# Patient Record
Sex: Male | Born: 1970 | Hispanic: No | Marital: Married | Smoking: Never smoker
Health system: Southern US, Community
[De-identification: ages and names within clinical notes are randomized; demographics above are authoritative.]

## PROBLEM LIST (undated history)

## (undated) DIAGNOSIS — R011 Cardiac murmur, unspecified: Secondary | ICD-10-CM

## (undated) HISTORY — PX: HERNIA REPAIR: SHX51

---

## 1999-06-11 ENCOUNTER — Emergency Department (HOSPITAL_COMMUNITY): Admission: EM | Admit: 1999-06-11 | Discharge: 1999-06-11 | Payer: Self-pay | Admitting: Emergency Medicine

## 2008-08-02 ENCOUNTER — Encounter: Admission: RE | Admit: 2008-08-02 | Discharge: 2008-08-02 | Payer: Self-pay | Admitting: Family Medicine

## 2015-09-30 ENCOUNTER — Encounter (HOSPITAL_COMMUNITY): Payer: Self-pay | Admitting: Emergency Medicine

## 2015-09-30 ENCOUNTER — Emergency Department (HOSPITAL_COMMUNITY)
Admission: EM | Admit: 2015-09-30 | Discharge: 2015-09-30 | Discharge status: Against Medical Advice | Payer: BLUE CROSS/BLUE SHIELD | Attending: Emergency Medicine | Admitting: Emergency Medicine

## 2015-09-30 ENCOUNTER — Emergency Department (HOSPITAL_COMMUNITY): Payer: BLUE CROSS/BLUE SHIELD

## 2015-09-30 DIAGNOSIS — R202 Paresthesia of skin: Secondary | ICD-10-CM | POA: Insufficient documentation

## 2015-09-30 DIAGNOSIS — R011 Cardiac murmur, unspecified: Secondary | ICD-10-CM | POA: Insufficient documentation

## 2015-09-30 DIAGNOSIS — R079 Chest pain, unspecified: Secondary | ICD-10-CM | POA: Insufficient documentation

## 2015-09-30 DIAGNOSIS — R0602 Shortness of breath: Secondary | ICD-10-CM | POA: Insufficient documentation

## 2015-09-30 DIAGNOSIS — R05 Cough: Secondary | ICD-10-CM | POA: Diagnosis not present

## 2015-09-30 HISTORY — DX: Cardiac murmur, unspecified: R01.1

## 2015-09-30 LAB — CBC
HEMATOCRIT: 41.6 % (ref 39.0–52.0)
Hemoglobin: 14.2 g/dL (ref 13.0–17.0)
MCH: 29.6 pg (ref 26.0–34.0)
MCHC: 34.1 g/dL (ref 30.0–36.0)
MCV: 86.8 fL (ref 78.0–100.0)
Platelets: 301 10*3/uL (ref 150–400)
RBC: 4.79 MIL/uL (ref 4.22–5.81)
RDW: 13.1 % (ref 11.5–15.5)
WBC: 8.2 10*3/uL (ref 4.0–10.5)

## 2015-09-30 LAB — BASIC METABOLIC PANEL
Anion gap: 8 (ref 5–15)
BUN: 14 mg/dL (ref 6–20)
CHLORIDE: 105 mmol/L (ref 101–111)
CO2: 28 mmol/L (ref 22–32)
Calcium: 8.9 mg/dL (ref 8.9–10.3)
Creatinine, Ser: 0.81 mg/dL (ref 0.61–1.24)
GFR calc non Af Amer: >60 mL/min (ref >60)
Glucose, Bld: 100 mg/dL — ABNORMAL HIGH (ref 65–99)
POTASSIUM: 4.3 mmol/L (ref 3.5–5.1)
SODIUM: 141 mmol/L (ref 135–145)

## 2015-09-30 LAB — I-STAT TROPONIN, ED: Troponin i, poc: 0 ng/mL (ref 0.00–0.08)

## 2015-09-30 NOTE — ED Notes (Signed)
Pt states that  He cannot wait any longer because he has a 43 year old son at home. Pt asked about results of test, explained that an MD must go over results. Pt asked if our MD could call him, explained that he should call his PCP tomorrow and ask them to get his results. Pt asking about cost. Explained that I did not have the answer to that but he should call his MD. Pt states understanding. Apologized for length of wait.

## 2015-09-30 NOTE — ED Notes (Signed)
Pt. reports left chest pain " pinching / tingling" with SOB and productive cough onset 4 days ago , denies nausea or diaphoresis .

## 2016-05-17 IMAGING — CR DG CHEST 2V
2 series · 2 of 2 positions shown · non-contrast
Comparison: 08/02/2008

CLINICAL DATA: Left-sided chest pain and cough for days.

EXAM:
CHEST  2 VIEW

[chest pa]
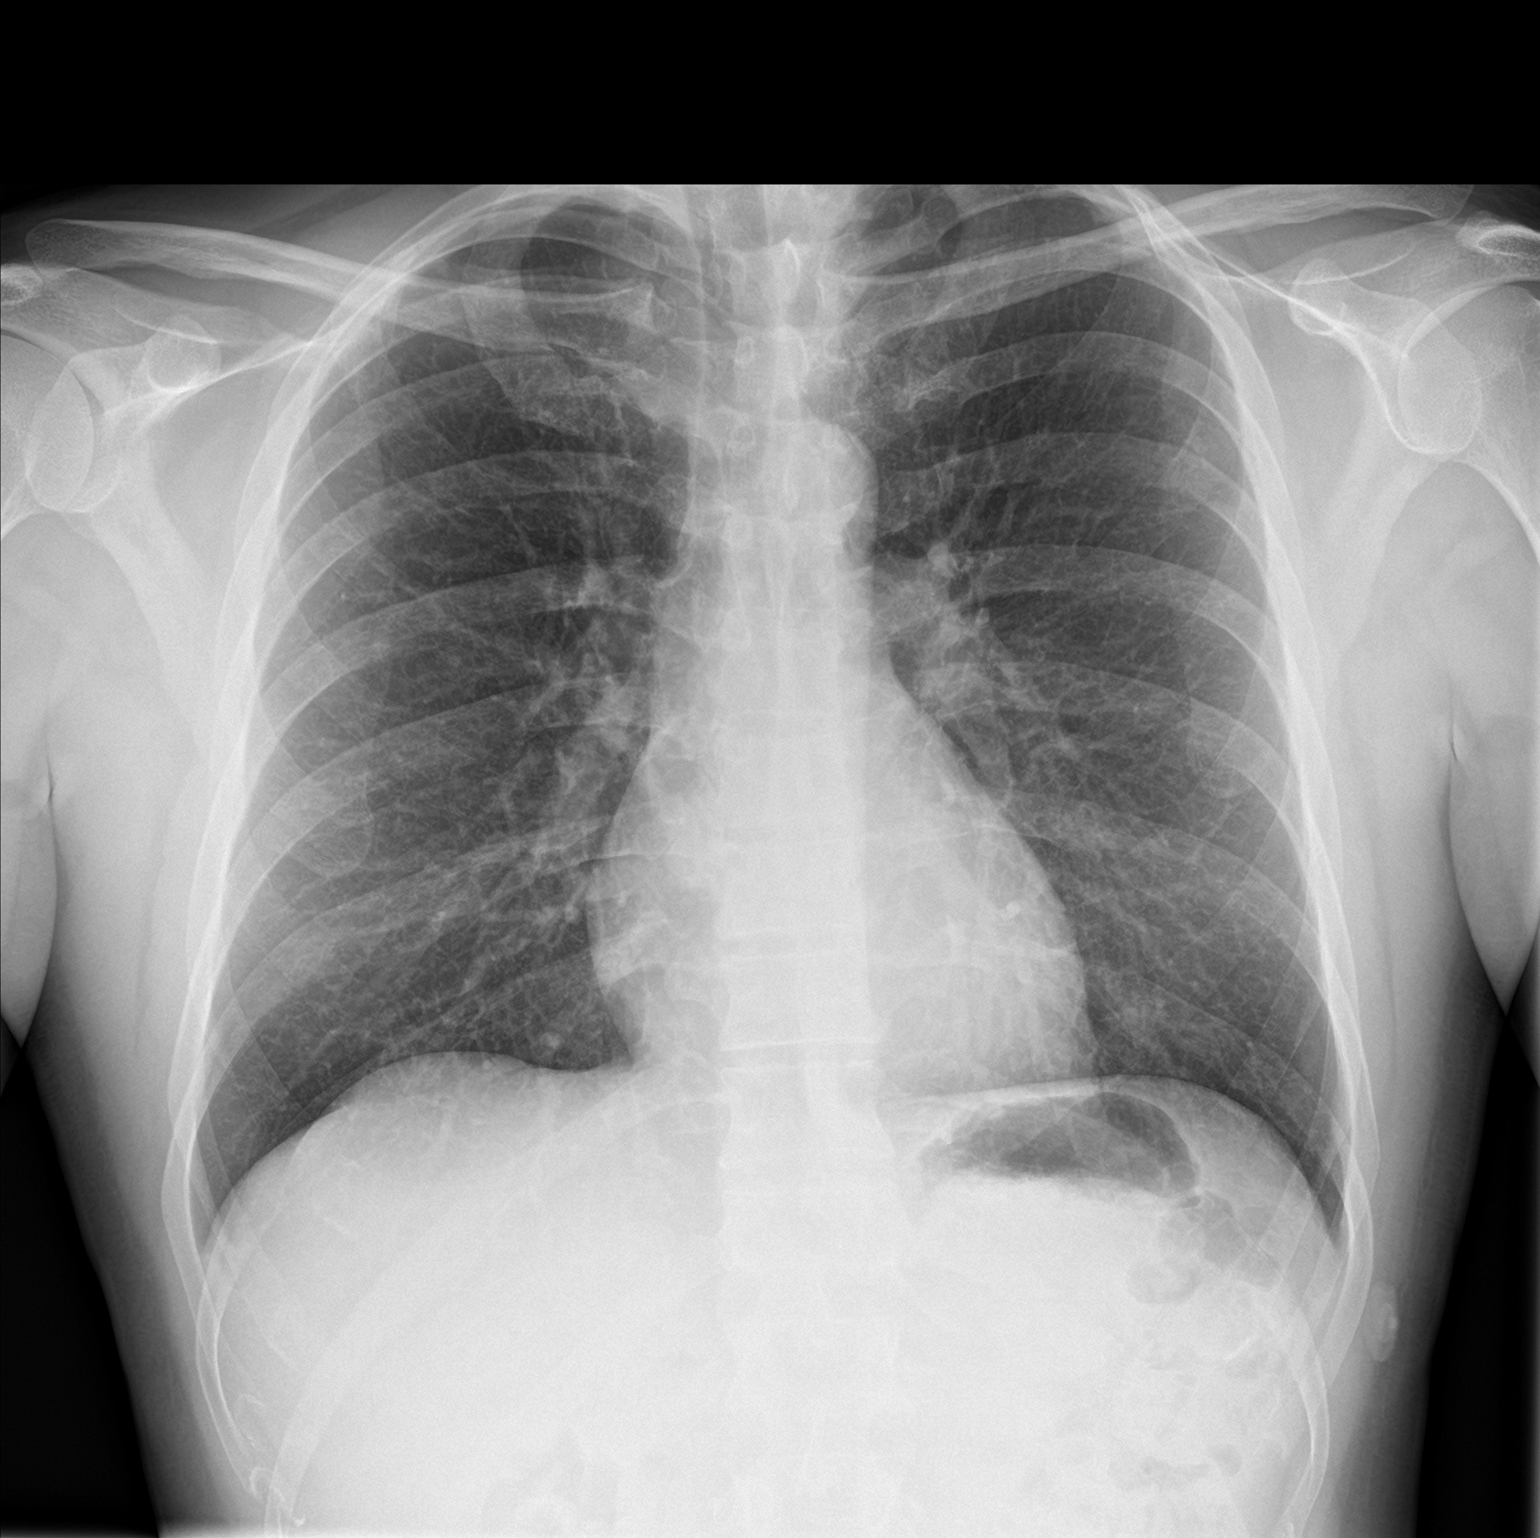

[chest lat]
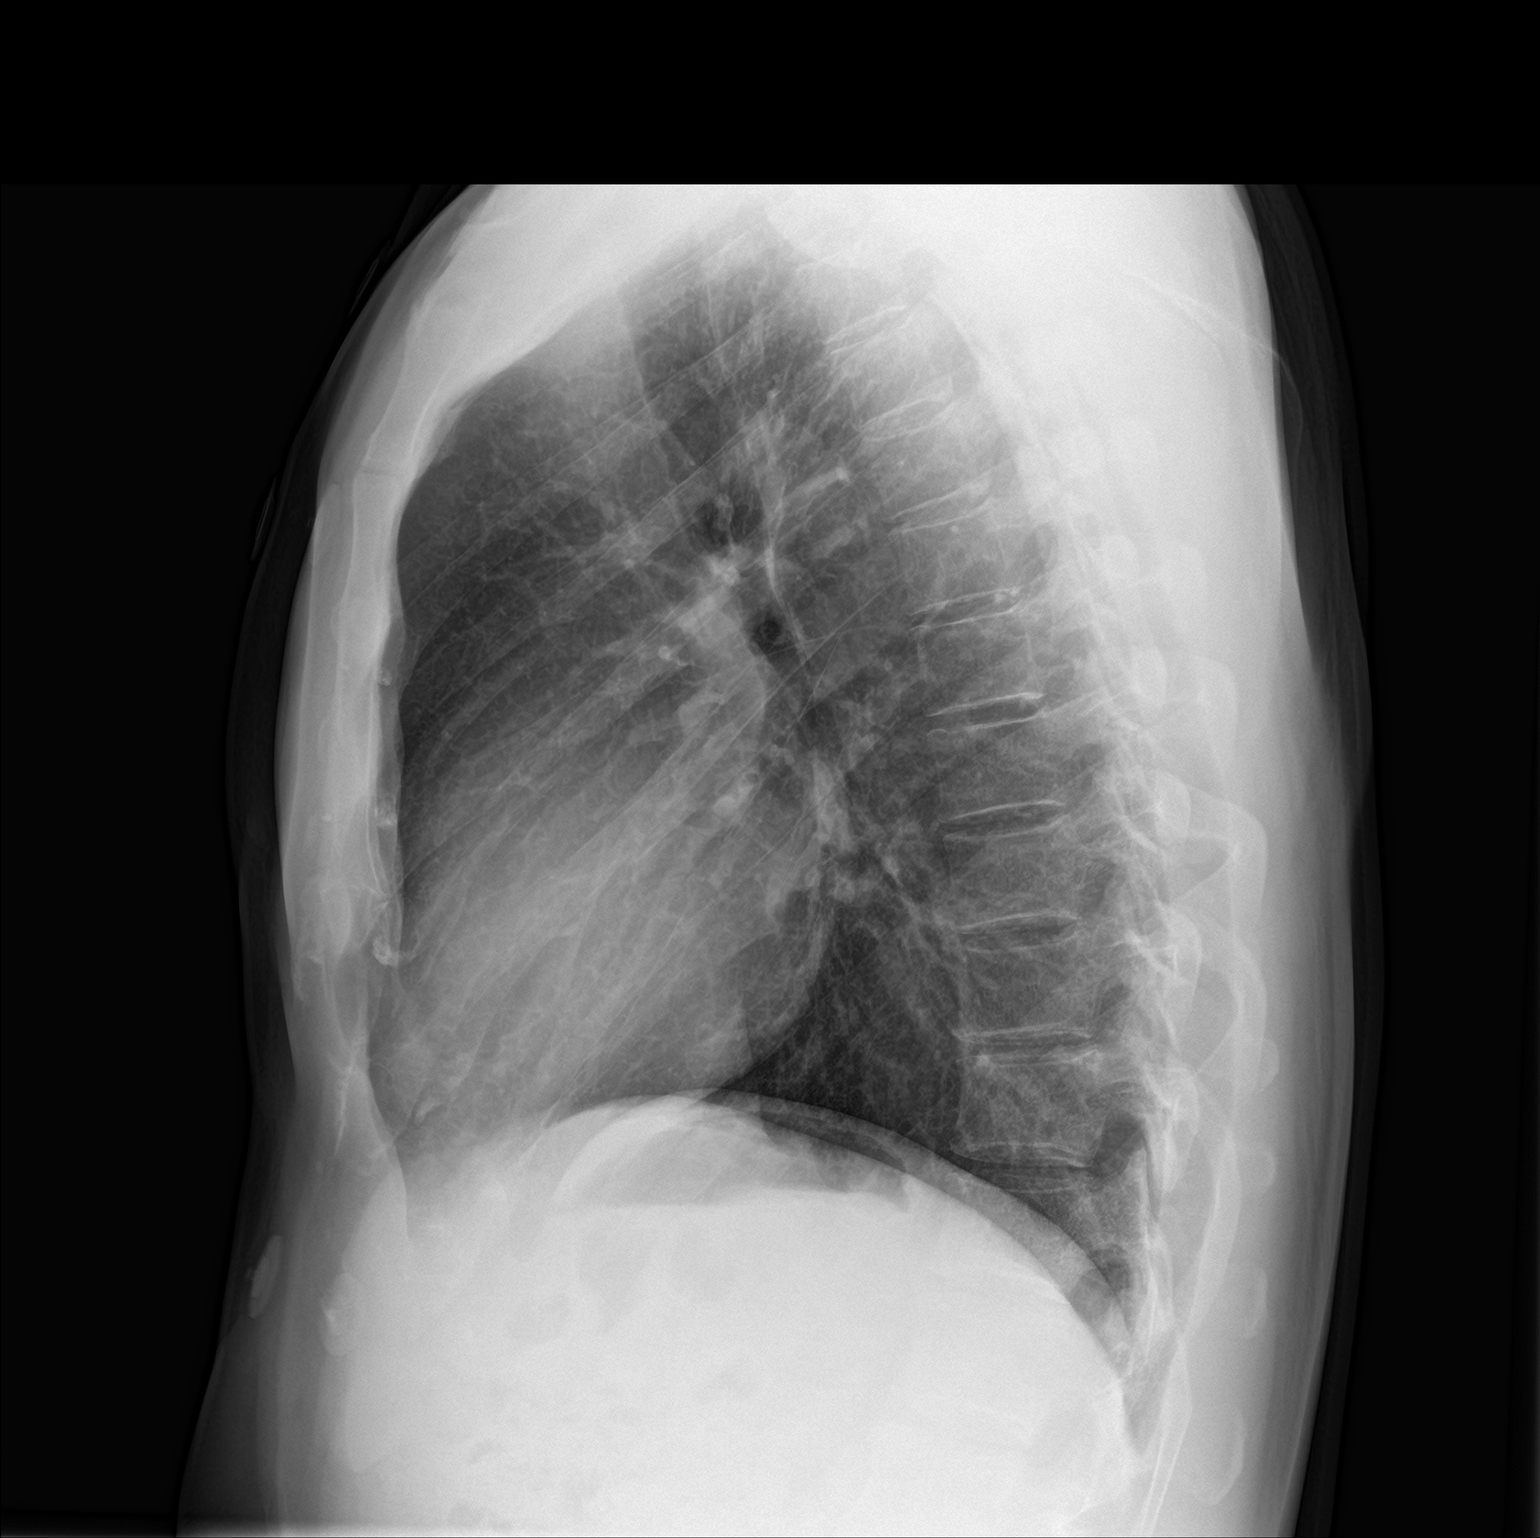

[2 of 2 positions shown; findings below may reference images not displayed]

FINDINGS: The cardiomediastinal contours are normal. Mild bronchial
thickening. Pulmonary vasculature is normal. No consolidation,
pleural effusion, or pneumothorax. No acute osseous abnormalities
are seen.
IMPRESSION: Mild bronchial thickening.  No consolidation to suggest pneumonia.

## 2016-12-31 DIAGNOSIS — R490 Dysphonia: Secondary | ICD-10-CM | POA: Diagnosis not present

## 2018-06-16 DIAGNOSIS — K120 Recurrent oral aphthae: Secondary | ICD-10-CM | POA: Diagnosis not present

## 2019-04-25 ENCOUNTER — Other Ambulatory Visit: Payer: Self-pay

## 2019-04-25 DIAGNOSIS — Z20822 Contact with and (suspected) exposure to covid-19: Secondary | ICD-10-CM

## 2019-04-25 DIAGNOSIS — R6889 Other general symptoms and signs: Secondary | ICD-10-CM | POA: Diagnosis not present

## 2019-04-27 LAB — NOVEL CORONAVIRUS, NAA: SARS-CoV-2, NAA: NOT DETECTED

## 2019-04-30 ENCOUNTER — Other Ambulatory Visit: Payer: Self-pay

## 2019-04-30 DIAGNOSIS — Z20822 Contact with and (suspected) exposure to covid-19: Secondary | ICD-10-CM

## 2019-05-01 LAB — NOVEL CORONAVIRUS, NAA: SARS-CoV-2, NAA: NOT DETECTED

## 2019-11-20 ENCOUNTER — Ambulatory Visit: Payer: 59 | Attending: Internal Medicine

## 2019-11-20 DIAGNOSIS — Z20822 Contact with and (suspected) exposure to covid-19: Secondary | ICD-10-CM | POA: Insufficient documentation

## 2019-11-21 LAB — NOVEL CORONAVIRUS, NAA: SARS-CoV-2, NAA: NOT DETECTED

## 2019-12-03 ENCOUNTER — Ambulatory Visit: Payer: 59 | Attending: Internal Medicine

## 2019-12-03 DIAGNOSIS — Z20822 Contact with and (suspected) exposure to covid-19: Secondary | ICD-10-CM | POA: Insufficient documentation

## 2019-12-04 LAB — NOVEL CORONAVIRUS, NAA: SARS-CoV-2, NAA: NOT DETECTED

## 2023-08-15 DIAGNOSIS — R490 Dysphonia: Secondary | ICD-10-CM | POA: Diagnosis not present

## 2023-08-15 DIAGNOSIS — J382 Nodules of vocal cords: Secondary | ICD-10-CM | POA: Diagnosis not present

## 2023-08-15 DIAGNOSIS — R053 Chronic cough: Secondary | ICD-10-CM | POA: Diagnosis not present
